# Patient Record
Sex: Female | Born: 1995 | Race: Black or African American | Hispanic: No | Marital: Single | Smoking: Never smoker
Health system: Southern US, Community
[De-identification: ages and names within clinical notes are randomized; demographics above are authoritative.]

## PROBLEM LIST (undated history)

## (undated) ENCOUNTER — Emergency Department (HOSPITAL_COMMUNITY): Disposition: A | Discharge status: Home / Self Care | Payer: Self-pay

## (undated) DIAGNOSIS — F209 Schizophrenia, unspecified: Secondary | ICD-10-CM

## (undated) DIAGNOSIS — R55 Syncope and collapse: Secondary | ICD-10-CM

## (undated) DIAGNOSIS — R413 Other amnesia: Secondary | ICD-10-CM

## (undated) HISTORY — DX: Other amnesia: R41.3

## (undated) HISTORY — DX: Syncope and collapse: R55

---
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
## 2016-01-11 ENCOUNTER — Emergency Department (HOSPITAL_COMMUNITY)
Admission: EM | Admit: 2016-01-11 | Discharge: 2016-01-12 | Disposition: A | Discharge status: Home / Self Care | Payer: Medicaid Other | Attending: Emergency Medicine | Admitting: Emergency Medicine

## 2016-01-11 ENCOUNTER — Encounter (HOSPITAL_COMMUNITY): Payer: Self-pay | Admitting: *Deleted

## 2016-01-11 DIAGNOSIS — F10220 Alcohol dependence with intoxication, uncomplicated: Secondary | ICD-10-CM | POA: Diagnosis not present

## 2016-01-11 DIAGNOSIS — R4182 Altered mental status, unspecified: Secondary | ICD-10-CM | POA: Diagnosis present

## 2016-01-11 DIAGNOSIS — F10920 Alcohol use, unspecified with intoxication, uncomplicated: Secondary | ICD-10-CM

## 2016-01-11 DIAGNOSIS — F1210 Cannabis abuse, uncomplicated: Secondary | ICD-10-CM | POA: Diagnosis not present

## 2016-01-11 DIAGNOSIS — Z79899 Other long term (current) drug therapy: Secondary | ICD-10-CM | POA: Insufficient documentation

## 2016-01-11 DIAGNOSIS — F209 Schizophrenia, unspecified: Secondary | ICD-10-CM | POA: Diagnosis not present

## 2016-01-11 MED ORDER — SODIUM CHLORIDE 0.9 % IV BOLUS (SEPSIS)
1000.0000 mL | Freq: Once | INTRAVENOUS | Status: AC
  Administered 2016-01-12: 1000 mL via INTRAVENOUS

## 2016-01-11 NOTE — ED Notes (Signed)
Pt arrives via ems. Per pt gf, pt had been drinking tonight, fell, did not hit her head and then became unresponsive. Pt was still unresponsive on arrival to the scene, lying on the front porch, when paramedic went to feel a pulse, pt jumped up and tried to go over the railing. Pt has continued to be combative. Pt has been coherent but uncooperative with questions. IV established, pt given Haladol 5, Versed 5, both given IM enroute.

## 2016-01-11 NOTE — ED Notes (Signed)
MD at bedside. 

## 2016-01-11 NOTE — ED Notes (Signed)
Bed: [id number] Expected date:  Expected time:  Means of arrival:  Comments: EMS 19yo F unresponsive episode / fighting EMS upon arrival

## 2016-01-11 NOTE — ED Provider Notes (Signed)
CSN: [id number]     Arrival date & time 01/11/16  2309 History  By signing my name below, I, Carrie Pierce, attest that this documentation has been prepared under the direction and in the presence of Allexis Bordenave, MD.  Electronically Signed: Rosario AdieWilliam Andrew Pierce, ED Scribe. 01/11/2016. 11:17 PM.  Chief Complaint  Patient presents with  . Altered Mental Status   Patient is a 20 y.o. female presenting with intoxication. The history is provided by the patient and the EMS personnel. The history is limited by the condition of the patient. No language interpreter was used.  Alcohol Intoxication This is a new problem. The problem occurs constantly. The problem has not changed since onset.Pertinent negatives include no chest pain. Nothing aggravates the symptoms. Nothing relieves the symptoms. She has tried nothing for the symptoms. The treatment provided no relief.   HPI Comments: Carrie Pierce is a 20 y.o. female brought in by ambulance who presents to the Emergency Department after a syncope episode s/p fall that occurred PTA. Per pts girlfriend, the patient fell after after a night of drinking and became unresponsive. Her girlfriend denies any head injury or the pt hitting her head. Per EMS, upon arrival she was combative. En route, she was given 5 of Haldol and 5 of Versed. Pt became alert in room with provider, and reports that she was drinking an unspecified amount of hard liquor. She denies SI currently or illicit drug usage PTA.   Past Medical History  Diagnosis Date  . Memory loss   . Syncope and collapse   . Schizophrenia (HCC)    History reviewed. No pertinent past surgical history. No family history on file. Social History  Substance Use Topics  . Smoking status: Never Smoker   . Smokeless tobacco: Never Used  . Alcohol Use: Yes   OB History    No data available     Review of Systems  Unable to perform ROS: Patient unresponsive  Cardiovascular: Negative for  chest pain.  Neurological: Positive for syncope.  Psychiatric/Behavioral: Negative for suicidal ideas.   Allergies  Review of patient's allergies indicates no known allergies.  Home Medications   Prior to Admission medications   Medication Sig Start Date End Date Taking? Authorizing Provider  ABILIFY 30 MG tablet Take 30 mg by mouth at bedtime. 04/04/15   Historical Provider, MD  ARIPiprazole (ABILIFY) 20 MG tablet Take 1 tablet (20 mg total) by mouth daily. Patient not taking: Reported on 05/06/2015 04/09/13   Jolene SchimkeKim B Winson, NP  hydrOXYzine (VISTARIL) 25 MG capsule Take 25 mg by mouth 2 (two) times daily. 04/02/15   Historical Provider, MD  permethrin (ELIMITE) 5 % cream Apply from the neck down and leave on overnight. Wash off in the morning Patient not taking: Reported on 05/06/2015 08/18/13   Rodolph BongEvan S Corey, MD  triamcinolone cream (KENALOG) 0.1 % Apply 1 application topically 2 (two) times daily. For itching Patient not taking: Reported on 05/06/2015 08/18/13   Rodolph BongEvan S Corey, MD   BP 114/57 mmHg  Pulse 88  Temp(Src) 98.2 F (36.8 C) (Oral)  Resp 20  SpO2 98%   Physical Exam  Constitutional: She appears well-developed and well-nourished. No distress.  HENT:  Head: Normocephalic and atraumatic. Head is without raccoon's eyes and without Battle's sign.  Right Ear: No hemotympanum.  Left Ear: No hemotympanum.  Mouth/Throat: Oropharynx is clear and moist. No oropharyngeal exudate.  Eyes: Conjunctivae are normal.  Pt has pinpoint pupils bilaterally.   Neck:  Trachea normal and normal range of motion. Neck supple. No JVD present. Carotid bruit is not present.  Cardiovascular: Normal rate, regular rhythm, normal heart sounds and intact distal pulses.  Exam reveals no gallop and no friction rub.   No murmur heard. Pulmonary/Chest: Effort normal and breath sounds normal. No stridor. She has no wheezes. She has no rales.  Abdominal: Soft. Bowel sounds are normal. She exhibits no mass. There is  no tenderness. There is no rebound and no guarding.  Musculoskeletal: Normal range of motion.  Lymphadenopathy:    She has no cervical adenopathy.  Neurological: She is alert. She has normal reflexes.  Skin: Skin is warm and dry. She is not diaphoretic.  Psychiatric:  unable  Nursing note and vitals reviewed.   ED Course  Procedures (including critical care time)  DIAGNOSTIC STUDIES: Oxygen Saturation is 98% on RA, normal by my interpretation.   Labs Review Labs Reviewed - No data to display  Imaging Review No results found. I have personally reviewed and evaluated these images and lab results as part of my medical decision-making.   EKG Interpretation None      MDM   Final diagnoses:  None   2:27 AM- Pt successfully completed PO challenged, is A&Ox4 and is currently stable for discharge.   Filed Vitals:   01/12/16 0200 01/12/16 0233  BP: 97/54 105/53  Pulse: 82 84  Temp:    Resp: 19 19   Results for orders placed or performed during the hospital encounter of 01/11/16  CBC with Differential/Platelet  Result Value Ref Range   WBC 6.4 4.0 - 10.5 K/uL   RBC 4.37 3.87 - 5.11 MIL/uL   Hemoglobin 12.7 12.0 - 15.0 g/dL   HCT 19.1 47.8 - 29.5 %   MCV 83.3 78.0 - 100.0 fL   MCH 29.1 26.0 - 34.0 pg   MCHC 34.9 30.0 - 36.0 g/dL   RDW 62.1 30.8 - 65.7 %   Platelets 196 150 - 400 K/uL   Neutrophils Relative % 77 %   Neutro Abs 5.0 1.7 - 7.7 K/uL   Lymphocytes Relative 18 %   Lymphs Abs 1.2 0.7 - 4.0 K/uL   Monocytes Relative 5 %   Monocytes Absolute 0.3 0.1 - 1.0 K/uL   Eosinophils Relative 0 %   Eosinophils Absolute 0.0 0.0 - 0.7 K/uL   Basophils Relative 0 %   Basophils Absolute 0.0 0.0 - 0.1 K/uL  Acetaminophen level  Result Value Ref Range   Acetaminophen (Tylenol), Serum <10 (L) 10 - 30 ug/mL  Salicylate level  Result Value Ref Range   Salicylate Lvl <4.0 2.8 - 30.0 mg/dL  Rapid urine drug screen (hospital performed)  Result Value Ref Range   Opiates  NONE DETECTED NONE DETECTED   Cocaine NONE DETECTED NONE DETECTED   Benzodiazepines POSITIVE (A) NONE DETECTED   Amphetamines NONE DETECTED NONE DETECTED   Tetrahydrocannabinol POSITIVE (A) NONE DETECTED   Barbiturates NONE DETECTED NONE DETECTED  Ethanol  Result Value Ref Range   Alcohol, Ethyl (B) 145 (H) <5 mg/dL  I-Stat Chem 8, ED  Result Value Ref Range   Sodium 144 135 - 145 mmol/L   Potassium 3.8 3.5 - 5.1 mmol/L   Chloride 107 101 - 111 mmol/L   BUN 3 (L) 6 - 20 mg/dL   Creatinine, Ser 8.46 0.44 - 1.00 mg/dL   Glucose, Bld 85 65 - 99 mg/dL   Calcium, Ion 9.62 9.52 - 1.30 mmol/L   TCO2 24 0 -  100 mmol/L   Hemoglobin 13.3 12.0 - 15.0 g/dL   HCT 40.939.0 81.136.0 - 91.446.0 %  I-Stat Beta hCG blood, ED ([facility], [facility], [facility] only)  Result Value Ref Range   I-stat hCG, quantitative <5.0 <5 mIU/mL   Comment 3           No results found.  Medications  sodium chloride 0.9 % bolus 1,000 mL (0 mLs Intravenous Stopped 01/12/16 0200)   I personally performed the services described in this documentation, which was scribed in my presence. The recorded information has been reviewed and is accurate.        Cy BlamerApril Baeleigh Devincent, MD 01/12/16 [phone]

## 2016-01-11 NOTE — ED Notes (Signed)
Pt not cooperative with triage questions, only shrugs her shoulders.

## 2016-01-12 ENCOUNTER — Encounter (HOSPITAL_COMMUNITY): Payer: Self-pay | Admitting: Emergency Medicine

## 2016-01-12 LAB — I-STAT CHEM 8, ED
BUN: 3 mg/dL — ABNORMAL LOW (ref 6–20)
CALCIUM ION: 1.13 mmol/L (ref 1.13–1.30)
CHLORIDE: 107 mmol/L (ref 101–111)
CREATININE: 0.7 mg/dL (ref 0.44–1.00)
GLUCOSE: 85 mg/dL (ref 65–99)
HCT: 39 % (ref 36.0–46.0)
HEMOGLOBIN: 13.3 g/dL (ref 12.0–15.0)
POTASSIUM: 3.8 mmol/L (ref 3.5–5.1)
Sodium: 144 mmol/L (ref 135–145)
TCO2: 24 mmol/L (ref 0–100)

## 2016-01-12 LAB — CBC WITH DIFFERENTIAL/PLATELET
BASOS ABS: 0 10*3/uL (ref 0.0–0.1)
Basophils Relative: 0 %
Eosinophils Absolute: 0 10*3/uL (ref 0.0–0.7)
Eosinophils Relative: 0 %
HEMATOCRIT: 36.4 % (ref 36.0–46.0)
Hemoglobin: 12.7 g/dL (ref 12.0–15.0)
LYMPHS ABS: 1.2 10*3/uL (ref 0.7–4.0)
LYMPHS PCT: 18 %
MCH: 29.1 pg (ref 26.0–34.0)
MCHC: 34.9 g/dL (ref 30.0–36.0)
MCV: 83.3 fL (ref 78.0–100.0)
MONO ABS: 0.3 10*3/uL (ref 0.1–1.0)
Monocytes Relative: 5 %
NEUTROS ABS: 5 10*3/uL (ref 1.7–7.7)
Neutrophils Relative %: 77 %
Platelets: 196 10*3/uL (ref 150–400)
RBC: 4.37 MIL/uL (ref 3.87–5.11)
RDW: 12.7 % (ref 11.5–15.5)
WBC: 6.4 10*3/uL (ref 4.0–10.5)

## 2016-01-12 LAB — RAPID URINE DRUG SCREEN, HOSP PERFORMED
Amphetamines: NOT DETECTED
BARBITURATES: NOT DETECTED
BENZODIAZEPINES: POSITIVE — AB
COCAINE: NOT DETECTED
Opiates: NOT DETECTED
TETRAHYDROCANNABINOL: POSITIVE — AB

## 2016-01-12 LAB — I-STAT BETA HCG BLOOD, ED (MC, WL, AP ONLY): I-stat hCG, quantitative: <5 m[IU]/mL (ref <5)

## 2016-01-12 LAB — ACETAMINOPHEN LEVEL: Acetaminophen (Tylenol), Serum: <10 ug/mL — ABNORMAL LOW (ref 10–30)

## 2016-01-12 LAB — ETHANOL: Alcohol, Ethyl (B): 145 mg/dL — ABNORMAL HIGH (ref <5)

## 2016-01-12 LAB — SALICYLATE LEVEL: Salicylate Lvl: <4 mg/dL (ref 2.8–30.0)

## 2016-01-12 NOTE — ED Notes (Signed)
Pt is fully oriented, ambulatory, and not suicidal

## 2016-01-12 NOTE — Discharge Instructions (Signed)
Alcohol Intoxication Alcohol intoxication occurs when you drink enough alcohol that it affects your ability to function. It can be mild or very severe. Drinking a lot of alcohol in a short time is called binge drinking. This can be very harmful. Drinking alcohol can also be more dangerous if you are taking medicines or other drugs. Some of the effects caused by alcohol may include:  Loss of coordination.  Changes in mood and behavior.  Unclear thinking.  Trouble talking (slurred speech).  Throwing up (vomiting).  Confusion.  Slowed breathing.  Twitching and shaking (seizures).  Loss of consciousness. HOME CARE  Do not drive after drinking alcohol.  Drink enough water and fluids to keep your pee (urine) clear or pale yellow. Avoid caffeine.  Only take medicine as told by your doctor. GET HELP IF:  You throw up (vomit) many times.  You do not feel better after a few days.  You frequently have alcohol intoxication. Your doctor can help decide if you should see a substance use treatment counselor. GET HELP RIGHT AWAY IF:  You become shaky when you stop drinking.  You have twitching and shaking.  You throw up blood. It may look bright red or like coffee grounds.  You notice blood in your poop (bowel movements).  You become lightheaded or pass out (faint). MAKE SURE YOU:   Understand these instructions.  Will watch your condition.  Will get help right away if you are not doing well or get worse.   This information is not intended to replace advice given to you by your health care provider. Make sure you discuss any questions you have with your health care provider.   Document Released: 12/18/2007 Document Revised: 03/03/2013 Document Reviewed: 12/04/2012 Elsevier Interactive Patient Education 2016 [employer]  Cannabis Use Disorder Cannabis use disorder is a mental disorder. It is not one-time or occasional use of cannabis, more commonly known as marijuana.  Cannabis use disorder is the continued, nonmedical use of cannabis that interferes with normal life activities or causes health problems. People with cannabis use disorder get a feeling of extreme pleasure and relaxation from cannabis use. This "high" is very rewarding and causes people to use over and over.  The mind-altering ingredient in cannabis is know as THC. THC can also interfere with motor coordination, memory, judgment, and accurate sense of space and time. These effects can last for a few days after using cannabis. Regular heavy cannabis use can cause long-lasting problems with thinking and learning. In young people, these problems may be permanent. Cannabis sometimes causes severe anxiety, paranoia, or visual hallucinations. Man-made (synthetic) cannabis-like drugs, such as "spice" and "K2," cause the same effects as THC but are much stronger. Cannabis-like drugs can cause dangerously high blood pressure and heart rate.  Cannabis use disorder usually starts in the teenage years. It can trigger the development of schizophrenia. It is somewhat more common in men than women. People who have family members with the disorder or existing mental health issues such as depression and posttraumatic stress disorderare more likely to develop cannabis use disorder. People with cannabis use disorder are at higher risk for use of other drugs of abuse.  SIGNS AND SYMPTOMS Signs and symptoms of cannabis use disorder include:   Use of cannabis in larger amounts or over a longer period than intended.   Unsuccessful attempts to cut down or control cannabis use.   A lot of time spent obtaining, using, or recovering from the effects of cannabis.  A strong desire or urge to use cannabis (cravings).   Continued use of cannabis in spite of problems at work, school, or home because of use.   Continued use of cannabis in spite of relationship problems because of use.  Giving up or cutting down on important  life activities because of cannabis use.  Use of cannabis over and over even in situations when it is physically hazardous, such as when driving a car.   Continued use of cannabis in spite of a physical problem that is likely related to use. Physical problems can include:  Chronic cough.  Bronchitis.  Emphysema.  Throat and lung cancer.  Continued use of cannabis in spite of a mental problem that is likely related to use. Mental problems can include:  Psychosis.  Anxiety.  Difficulty sleeping.  Need to use more and more cannabis to get the same effect, or lessened effect over time with use of the same amount (tolerance).  Having withdrawal symptoms when cannabis use is stopped, or using cannabis to reduce or avoid withdrawal symptoms. Withdrawal symptoms include:  Irritability or anger.  Anxiety or restlessness.  Difficulty sleeping.  Loss of appetite or weight.  Aches and pains.  Shakiness.  Sweating.  Chills. DIAGNOSIS Cannabis use disorder is diagnosed by your health care provider. You may be asked questions about your cannabis use and how it affects your life. A physical exam may be done. A drug screen may be done. You may be referred to a mental health professional. The diagnosis of cannabis use disorder requires at least two symptoms within 12 months. The type of cannabis use disorder you have depends on the number of symptoms you have. The type may be:  Mild. Two or three signs and symptoms.   Moderate. Four or five signs and symptoms.   Severe. Six or more signs and symptoms.  TREATMENT Treatment is usually provided by mental health professionals with training in substance use disorders. The following options are available:  Counseling or talk therapy. Talk therapy addresses the reasons you use cannabis. It also addresses ways to keep you from using again. The goals of talk therapy include:  Identifying and avoiding triggers for use.  Learning how  to handle cravings.  Replacing use with healthy activities.  Support groups. Support groups provide emotional support, advice, and guidance.  Medicine. Medicine is used to treat mental health issues that trigger cannabis use or that result from it. HOME CARE INSTRUCTIONS  Take medicines only as directed by your health care provider.  Check with your health care provider before starting any new medicines.  Keep all follow-up visits as directed by your health care provider. SEEK MEDICAL CARE IF:  You are not able to take your medicines as directed.  Your symptoms get worse. SEEK IMMEDIATE MEDICAL CARE IF: You have serious thoughts about hurting yourself or others. FOR MORE INFORMATION  National Institute on Drug Abuse: http://www.price-smith.com/www.drugabuse.gov  Substance Abuse and Mental Health Services Administration: SkateOasis.com.ptwww.samhsa.gov   This information is not intended to replace advice given to you by your health care provider. Make sure you discuss any questions you have with your health care provider.   Document Released: 06/28/2000 Document Revised: 07/22/2014 Document Reviewed: 07/14/2013 Elsevier Interactive Patient Education [employer] Elsevier Inc.

## 2016-02-18 ENCOUNTER — Encounter (HOSPITAL_COMMUNITY): Payer: Self-pay | Admitting: *Deleted

## 2016-02-18 ENCOUNTER — Emergency Department (HOSPITAL_COMMUNITY)
Admission: EM | Admit: 2016-02-18 | Discharge: 2016-02-18 | Disposition: A | Discharge status: Home / Self Care | Payer: Medicaid Other | Attending: Emergency Medicine | Admitting: Emergency Medicine

## 2016-02-18 DIAGNOSIS — N63 Unspecified lump in unspecified breast: Secondary | ICD-10-CM

## 2016-02-18 MED ORDER — DIPHENHYDRAMINE HCL 25 MG PO TABS: 25.0000 mg | ORAL_TABLET | Freq: Four times a day (QID) | ORAL | 0 refills | Status: DC

## 2016-02-18 NOTE — ED Triage Notes (Signed)
Pt complains of left breast swelling and redness for the past week. Pt denies discharge. Pt states her breast itches.

## 2016-02-18 NOTE — ED Provider Notes (Signed)
[facility]-EMERGENCY DEPT Provider Note   CSN: [id number] Arrival date & time: 02/18/16  4132  First Provider Contact:  None       History   Chief Complaint Chief Complaint  Patient presents with  . Breast Problem    HPI Carrie Pierce is a 20 y.o. female. PMH significant for schizophrenia and marijuana use. She states that over the past several weeks she has noticed her L breast is slightly more swollen than the right. She is worried because her grandmother (who is in there 22s) was just diagnosed with breast cancer. She reports associated itching at times. She denies fever, chills, redness to the area, pain over the breast, lump in the breast, nipple discharge. She denies pregnancy and states her periods are irregular (5 times a year). LMP was 01/28/2016.   HPI  Past Medical History:  Diagnosis Date  . Memory loss   . Schizophrenia (HCC)   . Syncope and collapse     Patient Active Problem List   Diagnosis Date Noted  . Cannabis abuse 04/05/2013  . Paranoid schizophrenia (HCC) 04/04/2013    History reviewed. No pertinent surgical history.  OB History    No data available       Home Medications    Prior to Admission medications   Medication Sig Start Date End Date Taking? Authorizing Provider  diphenhydrAMINE (BENADRYL) 25 MG tablet Take 1 tablet (25 mg total) by mouth every 6 (six) hours. For itching 02/18/16   Bethel Born, PA-C    Family History No family history on file.  Social History Social History  Substance Use Topics  . Smoking status: Never Smoker  . Smokeless tobacco: Never Used  . Alcohol use Yes     Allergies   Review of patient's allergies indicates no known allergies.   Review of Systems Review of Systems  Constitutional: Negative for fever and unexpected weight change.  Genitourinary:       +Breast swelling. -Breast tenderness, nipple discharge, erythema of breast, lump in breast     Physical Exam Updated Vital Signs BP  103/76   Pulse 83   Temp 98 F (36.7 C) (Oral)   Resp 16   LMP 01/28/2016   SpO2 100%   Physical Exam  Constitutional: She is oriented to person, place, and time. She appears well-developed and well-nourished. No distress.  HENT:  Head: Normocephalic and atraumatic.  Eyes: Conjunctivae are normal. Pupils are equal, round, and reactive to light. Right eye exhibits no discharge. Left eye exhibits no discharge. No scleral icterus.  Neck: Normal range of motion. Neck supple.  Cardiovascular: Normal rate and regular rhythm.   No murmur heard. Pulmonary/Chest: Effort normal and breath sounds normal. No respiratory distress. Right breast exhibits no inverted nipple, no mass, no nipple discharge, no skin change and no tenderness. Left breast exhibits no inverted nipple, no mass, no nipple discharge, no skin change and no tenderness. Breasts are symmetrical. There is no breast swelling.  Abdominal: Soft. She exhibits no distension. There is no tenderness.  Genitourinary: No breast tenderness, discharge or bleeding.  Musculoskeletal: She exhibits no edema.  Neurological: She is alert and oriented to person, place, and time.  Skin: Skin is warm and dry.  Psychiatric: She has a normal mood and affect. Her behavior is normal.  Nursing note and vitals reviewed.    ED Treatments / Results  Labs (all labs ordered are listed, but only abnormal results are displayed) Labs Reviewed - No data to display  EKG  EKG Interpretation None       Radiology No results found.  Procedures Procedures (including critical care time)  Medications Ordered in ED Medications - No data to display   Initial Impression / Assessment and Plan / ED Course  I have reviewed the triage vital signs and the nursing notes.  Pertinent labs & imaging results that were available during my care of the patient were reviewed by me and considered in my medical decision making (see chart for details).  Clinical Course     20 year old female presents with a complaint of breast swelling. Breast appears normal to me; unclear etiology. Advised there is nothing emergent that would warrant imaging at this time. OBGYN follow up given. Benadryl given for itching. Patient is NAD, non-toxic, with stable VS. Patient is informed of clinical course, understands medical decision making process, and agrees with plan. Opportunity for questions provided and all questions answered. Return precautions given.   Final Clinical Impressions(s) / ED Diagnoses   Final diagnoses:  Breast swelling    New Prescriptions New Prescriptions   DIPHENHYDRAMINE (BENADRYL) 25 MG TABLET    Take 1 tablet (25 mg total) by mouth every 6 (six) hours. For itching     Bethel BornKelly Marie Cedarius Kersh, PA-C 02/19/16 1726    Lyndal Pulleyaniel Knott, MD 02/21/16 [phone]

## 2016-04-06 ENCOUNTER — Encounter (HOSPITAL_COMMUNITY): Payer: Self-pay | Admitting: *Deleted

## 2016-04-06 ENCOUNTER — Emergency Department (HOSPITAL_COMMUNITY): Payer: Medicaid Other

## 2016-04-06 ENCOUNTER — Emergency Department (HOSPITAL_COMMUNITY)
Admission: EM | Admit: 2016-04-06 | Discharge: 2016-04-06 | Disposition: A | Discharge status: Home / Self Care | Payer: Medicaid Other | Attending: Emergency Medicine | Admitting: Emergency Medicine

## 2016-04-06 DIAGNOSIS — Y929 Unspecified place or not applicable: Secondary | ICD-10-CM | POA: Diagnosis not present

## 2016-04-06 DIAGNOSIS — Y939 Activity, unspecified: Secondary | ICD-10-CM | POA: Diagnosis not present

## 2016-04-06 DIAGNOSIS — Y999 Unspecified external cause status: Secondary | ICD-10-CM | POA: Diagnosis not present

## 2016-04-06 DIAGNOSIS — W2201XA Walked into wall, initial encounter: Secondary | ICD-10-CM | POA: Diagnosis not present

## 2016-04-06 DIAGNOSIS — S62326A Displaced fracture of shaft of fifth metacarpal bone, right hand, initial encounter for closed fracture: Secondary | ICD-10-CM | POA: Diagnosis not present

## 2016-04-06 DIAGNOSIS — S62309A Unspecified fracture of unspecified metacarpal bone, initial encounter for closed fracture: Secondary | ICD-10-CM

## 2016-04-06 DIAGNOSIS — S6991XA Unspecified injury of right wrist, hand and finger(s), initial encounter: Secondary | ICD-10-CM | POA: Diagnosis present

## 2016-04-06 MED ORDER — HYDROCODONE-ACETAMINOPHEN 5-325 MG PO TABS
2.0000 | ORAL_TABLET | Freq: Once | ORAL | Status: AC
  Administered 2016-04-06: 2 via ORAL
  Filled 2016-04-06: qty 2

## 2016-04-06 MED ORDER — HYDROCODONE-ACETAMINOPHEN 5-325 MG PO TABS: 1.0000 | ORAL_TABLET | Freq: Four times a day (QID) | ORAL | 0 refills | Status: DC | PRN

## 2016-04-06 NOTE — ED Notes (Signed)
PA-C to see and assess patient before RN assessment. See PA note. 

## 2016-04-06 NOTE — ED Triage Notes (Signed)
The pt struck a wall with her rt hand early this am last night.  She has bruising and swelling over her rt  5th metacarpal  lmp last month

## 2016-04-06 NOTE — ED Notes (Signed)
Ortho tech notified of need for splint 

## 2016-04-06 NOTE — ED Notes (Signed)
Ortho tech at bedside 

## 2016-04-06 NOTE — Progress Notes (Signed)
Orthopedic Tech Progress Note Patient Details:  Carrie Pierce 10/03/1995 [id number]  Ortho Devices Type of Ortho Device: Arm sling, Ulna gutter splint Ortho Device/Splint Location: rue Ortho Device/Splint Interventions: Ordered, Application   Carrie Pierce, Henson Fraticelli J 04/06/2016, 9:57 PM

## 2016-04-06 NOTE — ED Provider Notes (Signed)
[facility]-EMERGENCY DEPT Provider Note   CSN: [id number] Arrival date & time: 04/06/16  1913  By signing my name below, I, Christy SartoriusAnastasia Kolousek, attest that this documentation has been prepared under the direction and in the presence of  Roxy Horsemanobert Donyelle Enyeart, [place]PA-C. Electronically Signed: Christy SartoriusAnastasia Kolousek, ED Scribe. 04/06/16. 8:50 PM.  History   Chief Complaint Chief Complaint  Patient presents with  . Hand Injury   The history is provided by the patient and medical records. No language interpreter was used.    HPI Comments:  Carrie Pierce is a 20 y.o. female who presents to the Emergency Department complaining of injury to her right hand after punching a cement wall this morning.  Her pain is worse with movement of her wrist and fingers.  No alleviating factors noted.  No additional injury or complaint.  Pt has a fresh tattoo on her right forearm.    Past Medical History:  Diagnosis Date  . Memory loss   . Schizophrenia (HCC)   . Syncope and collapse     Patient Active Problem List   Diagnosis Date Noted  . Cannabis abuse 04/05/2013  . Paranoid schizophrenia (HCC) 04/04/2013    History reviewed. No pertinent surgical history.  OB History    No data available       Home Medications    Prior to Admission medications   Medication Sig Start Date End Date Taking? Authorizing Provider  diphenhydrAMINE (BENADRYL) 25 MG tablet Take 1 tablet (25 mg total) by mouth every 6 (six) hours. For itching 02/18/16   Bethel BornKelly Marie Gekas, PA-C    Family History No family history on file.  Social History Social History  Substance Use Topics  . Smoking status: Never Smoker  . Smokeless tobacco: Never Used  . Alcohol use Yes     Allergies   Review of patient's allergies indicates no known allergies.   Review of Systems Review of Systems  Musculoskeletal: Positive for arthralgias and myalgias.  Skin: Negative for color change and wound.  Neurological: Negative for weakness and  numbness.  All other systems reviewed and are negative.    Physical Exam Updated Vital Signs BP 121/62 (BP Location: Left Arm)   Pulse 86   Temp 98.5 F (36.9 C) (Oral)   Resp 16   Ht 5\' 6"  (1.676 m)   Wt 130 lb (59 kg)   LMP 03/06/2016   SpO2 100%   BMI 20.98 kg/m   Physical Exam  Physical Exam  Constitutional: Pt appears well-developed and well-nourished. No distress.  HENT:  Head: Normocephalic and atraumatic.  Eyes: Conjunctivae are normal.  Neck: Normal range of motion.  Cardiovascular: Normal rate, regular rhythm and intact distal pulses.   Capillary refill < 3 sec  Pulmonary/Chest: Effort normal and breath sounds normal.  Musculoskeletal: Right Hand: Pt exhibits tenderness to palpation over right proximal right 5th metacarpal. Pt exhibits no edema.  ROM: 4/5 limited by pain  Neurological: Pt  is alert. Coordination normal.  Sensation 5/5 Strength 4/5 limited by pain  Skin: Skin is warm and dry. Pt is not diaphoretic.  No tenting of the skin Skin intact  Psychiatric: Pt has a normal mood and affect.  Nursing note and vitals reviewed.   ED Treatments / Results   DIAGNOSTIC STUDIES:  Oxygen Saturation is 100% on RA, NML by my interpretation.    COORDINATION OF CARE:  8:50 PM Discussed treatment plan with pt at bedside and pt agreed to plan.  Labs (all labs ordered are  listed, but only abnormal results are displayed) Labs Reviewed - No data to display  EKG  EKG Interpretation None       Radiology Dg Hand Complete Right  Result Date: 04/06/2016 CLINICAL DATA:  Patient struck wall EXAM: RIGHT HAND - COMPLETE 3+ VIEW COMPARISON:  None. FINDINGS: Frontal, oblique, and lateral views were obtained. There is a fracture of the proximal fifth metacarpal with slight medial displacement and volar angulation of the distal fracture fragment with respect to the proximal fragment. There is soft tissue swelling in this area. No other fracture. No dislocation.  Joint spaces appear intact. IMPRESSION: Mildly displaced and angulated fracture proximal aspect fifth metacarpal. No other fracture. No dislocation. Joint spaces appear unremarkable. Electronically Signed   By: Bretta Bang III M.D.   On: 04/06/2016 20:20    Procedures Procedures (including critical care time)  Medications Ordered in ED Medications  HYDROcodone-acetaminophen (NORCO/VICODIN) 5-325 MG per tablet 2 tablet (not administered)     Initial Impression / Assessment and Plan / ED Course  I have reviewed the triage vital signs and the nursing notes.  Pertinent labs & imaging results that were available during my care of the patient were reviewed by me and considered in my medical decision making (see chart for details).  Clinical Course    SPLINT APPLICATION Date/Time: 9:32 PM Authorized by: Roxy Horseman Consent: Verbal consent obtained. Risks and benefits: risks, benefits and alternatives were discussed Consent given by: patient Splint applied by: orthopedic technician Location details: right hand Splint type: ulnar gutter Supplies used: fiberglass and ace Post-procedure: The splinted body part was neurovascularly unchanged following the procedure. Patient tolerance: Patient tolerated the procedure well with no immediate complications.  Patient with base of right 5th metacarpal fracture.  Will need to see hand surgery for possible pinning.  Will give referral and splint in ED with ulnar gutter splint.   Final Clinical Impressions(s) / ED Diagnoses   Final diagnoses:  Metacarpal bone fracture, closed, initial encounter    New Prescriptions New Prescriptions   HYDROCODONE-ACETAMINOPHEN (NORCO/VICODIN) 5-325 MG TABLET    Take 1-2 tablets by mouth every 6 (six) hours as needed.   I personally performed the services described in this documentation, which was scribed in my presence. The recorded information has been reviewed and is accurate.       Roxy Horseman, PA-C 04/06/16 2134    Lavera Guise, MD 04/07/16 [id number]

## 2016-04-06 NOTE — ED Notes (Signed)
Patient verbalized understanding of discharge instructions and denies any further needs or questions at this time. VS stable. Patient ambulatory with steady gait. Pt declined wheelchair, RN escorted to ED entrance.   

## 2016-12-09 ENCOUNTER — Encounter: Payer: Self-pay | Admitting: *Deleted

## 2016-12-09 ENCOUNTER — Emergency Department
Admission: EM | Admit: 2016-12-09 | Discharge: 2016-12-09 | Disposition: A | Discharge status: Home / Self Care | Payer: Medicaid Other | Attending: Emergency Medicine | Admitting: Emergency Medicine

## 2016-12-09 DIAGNOSIS — R21 Rash and other nonspecific skin eruption: Secondary | ICD-10-CM | POA: Diagnosis present

## 2016-12-09 MED ORDER — PREDNISONE 10 MG (21) PO TBPK: ORAL_TABLET | ORAL | 0 refills | Status: DC

## 2016-12-09 NOTE — ED Triage Notes (Signed)
Pt was seen at urgent care Monday for a rash to bilateral arms and legs, pt diagnosed with eczema, pt reports she now has bruises to bilateral legs, pt denies any other symptoms

## 2016-12-09 NOTE — ED Notes (Signed)
Pt states that she has had bruises appearing on her legs over the last 2 days - she also reports fine red rash on bilat lower ext and the rash itches

## 2016-12-09 NOTE — ED Provider Notes (Signed)
[facility] Emergency Department Provider Note  ____________________________________________  Time seen: Approximately 11:35 PM  I have reviewed the triage vital signs and the nursing notes.   HISTORY  Chief Complaint Rash    HPI Carrie Pierce is a 21 y.o. female that presents to emergency department for evaluation of a couple of red spots to her arms and legs that have been present for 1 week. They are nontender, and they do not itch. She does not recall an insect biting her. Patient went to urgent care last Monday and was told that she has eczema. She was prescribed a cream but never picked it up. She does not think it is eczema. She is frustrated that the spots do not go away. She states that she also has a couple new bruises on her lower legs for a couple of days. They are nontender. She does not remember hitting her legs on anything. She plays basketball but has not been playing recently. She denies fever, shortness breath, chest pain, nausea, vomiting, abdominal pain, numbness, tingling.   Past Medical History:  Diagnosis Date  . Memory loss   . Schizophrenia (HCC)   . Syncope and collapse     Patient Active Problem List   Diagnosis Date Noted  . Cannabis abuse 04/05/2013  . Paranoid schizophrenia (HCC) 04/04/2013    History reviewed. No pertinent surgical history.  Prior to Admission medications   Medication Sig Start Date End Date Taking? Authorizing Provider  diphenhydrAMINE (BENADRYL) 25 MG tablet Take 1 tablet (25 mg total) by mouth every 6 (six) hours. For itching 02/18/16   Bethel Born, PA-C  HYDROcodone-acetaminophen (NORCO/VICODIN) 5-325 MG tablet Take 1-2 tablets by mouth every 6 (six) hours as needed. 04/06/16   Roxy Horseman, PA-C  predniSONE (STERAPRED UNI-PAK 21 TAB) 10 MG (21) TBPK tablet Take 6 tablets on day 1, take 5 tablets on day 2, take 4 tablets on day 3, take 3 tablets on day 4, take 2 tablets on day 5, take 1  tablet on day 6 12/09/16   Enid Derry, PA-C    Allergies Patient has no known allergies.  No family history on file.  Social History Social History  Substance Use Topics  . Smoking status: Never Smoker  . Smokeless tobacco: Never Used  . Alcohol use Yes     Review of Systems  Constitutional: No fever/chills Cardiovascular: No chest pain. Respiratory: No SOB. Gastrointestinal: No abdominal pain.  No nausea, no vomiting.  Musculoskeletal: Negative for musculoskeletal pain. Skin: Negative for abrasions, lacerations. Neurological: Negative for headaches, numbness or tingling   ____________________________________________   PHYSICAL EXAM:  VITAL SIGNS: ED Triage Vitals  Enc Vitals Group     BP 12/09/16 2229 131/69     Pulse Rate 12/09/16 2229 74     Resp 12/09/16 2229 20     Temp 12/09/16 2229 98.7 F (37.1 C)     Temp Source 12/09/16 2229 Oral     SpO2 12/09/16 2229 99 %     Weight 12/09/16 2229 132 lb (59.9 kg)     Height 12/09/16 2229 5\' 7"  (1.702 m)     Head Circumference --      Peak Flow --      Pain Score 12/09/16 2239 3     Pain Loc --      Pain Edu? --      Excl. in GC? --      Constitutional: Alert and oriented. Well appearing and in no  acute distress. Eyes: Conjunctivae are normal. PERRL. EOMI. Head: Atraumatic. ENT:      Ears:      Nose: No congestion/rhinnorhea.      Mouth/Throat: Mucous membranes are moist.  Neck: No stridor.   Cardiovascular: Normal rate, regular rhythm.  Good peripheral circulation. Respiratory: Normal respiratory effort without tachypnea or retractions. Lungs CTAB. Good air entry to the bases with no decreased or absent breath sounds. Musculoskeletal: Full range of motion to all extremities. No gross deformities appreciated. Neurologic:  Normal speech and language. No gross focal neurologic deficits are appreciated.  Skin:  Skin is warm, dry and intact. 2, 1 cm nontender areas of ecchymosis to lower legs bilaterally. 1/4  cm red macule on right arm. 4, 1/4 cm red macules on bilateral lower legs. No scaling or drainage. Nontender.   ____________________________________________   LABS (all labs ordered are listed, but only abnormal results are displayed)  Labs Reviewed - No data to display ____________________________________________  EKG   ____________________________________________  RADIOLOGY  No results found.  ____________________________________________    PROCEDURES  Procedure(s) performed:    Procedures    Medications - No data to display   ____________________________________________   INITIAL IMPRESSION / ASSESSMENT AND PLAN / ED COURSE  Pertinent labs & imaging results that were available during my care of the patient were reviewed by me and considered in my medical decision making (see chart for details).  Review of the [place] CSRS was performed in accordance of the [facility] prior to dispensing any controlled drugs.   Patient presented to the emergency department with rash for 1 week. Vital signs and exam are reassuring. Patient will be discharged home with prescriptions for prednisone. Patient is to follow up with PCP as directed. Patient is given ED precautions to return to the ED for any worsening or new symptoms.     ____________________________________________  FINAL CLINICAL IMPRESSION(S) / ED DIAGNOSES  Final diagnoses:  Rash and nonspecific skin eruption      NEW MEDICATIONS STARTED DURING THIS VISIT:  Discharge Medication List as of 12/09/2016 11:33 PM    START taking these medications   Details  predniSONE (STERAPRED UNI-PAK 21 TAB) 10 MG (21) TBPK tablet Take 6 tablets on day 1, take 5 tablets on day 2, take 4 tablets on day 3, take 3 tablets on day 4, take 2 tablets on day 5, take 1 tablet on day 6, Print            This chart was dictated using voice recognition software/Dragon. Despite best efforts to proofread, errors can occur which can  change the meaning. Any change was purely unintentional.    Enid DerryWagner, Amiri Riechers, PA-C 12/09/16 2346    Jeanmarie PlantMcShane, James A, MD 12/14/16 2232

## 2020-07-11 ENCOUNTER — Other Ambulatory Visit: Payer: Self-pay

## 2020-07-11 ENCOUNTER — Emergency Department (HOSPITAL_COMMUNITY): Payer: Medicaid Other

## 2020-07-11 ENCOUNTER — Emergency Department (HOSPITAL_COMMUNITY)
Admission: EM | Admit: 2020-07-11 | Discharge: 2020-07-11 | Disposition: A | Discharge status: Home / Self Care | Payer: Medicaid Other | Attending: Emergency Medicine | Admitting: Emergency Medicine

## 2020-07-11 ENCOUNTER — Encounter (HOSPITAL_COMMUNITY): Payer: Self-pay

## 2020-07-11 DIAGNOSIS — R0981 Nasal congestion: Secondary | ICD-10-CM | POA: Insufficient documentation

## 2020-07-11 DIAGNOSIS — R059 Cough, unspecified: Secondary | ICD-10-CM | POA: Diagnosis not present

## 2020-07-11 MED ORDER — BENZONATATE 100 MG PO CAPS: 100.0000 mg | ORAL_CAPSULE | Freq: Three times a day (TID) | ORAL | 0 refills | Status: DC | PRN

## 2020-07-11 NOTE — ED Provider Notes (Signed)
[name] EMERGENCY DEPARTMENT Provider Note   CSN: [id number] Arrival date & time: 07/11/20  1519     History Chief Complaint  Patient presents with   Cough    Carrie Pierce is a 24 y.o. female with past medical history as listed below.  HPI Patient presents to emergency department today with chief complaint of nonproductive cough x6 weeks.  She states the cough has been intermittent although is present most days.  She is also endorsing that her left nostril is tender inside.  This is been going on x2 days.  She denies any epistaxis.  She does endorse nasal congestion.  No medications tried for symptoms prior to arrival.  Patient states she vapes frequently, infrequently smokes Black and mild.  She denies any fever or chills, hoarseness, shortness of breath, chest pain.  No sick contacts or known Covid exposures.    Past Medical History:  Diagnosis Date   Memory loss    Schizophrenia (HCC)    Syncope and collapse     Patient Active Problem List   Diagnosis Date Noted   Cannabis abuse 04/05/2013   Paranoid schizophrenia (HCC) 04/04/2013    History reviewed. No pertinent surgical history.   OB History   No obstetric history on file.     No family history on file.  Social History   Tobacco Use   Smoking status: Never Smoker   Smokeless tobacco: Never Used  Substance Use Topics   Alcohol use: Yes   Drug use: No    Home Medications Prior to Admission medications   Medication Sig Start Date End Date Taking? Authorizing Provider  benzonatate (TESSALON) 100 MG capsule Take 1 capsule (100 mg total) by mouth every 8 (eight) hours as needed for cough. 07/11/20  Yes Walisiewicz, Mackinzee Roszak E, PA-C  diphenhydrAMINE (BENADRYL) 25 MG tablet Take 1 tablet (25 mg total) by mouth every 6 (six) hours. For itching 02/18/16   Bethel Born, PA-C  HYDROcodone-acetaminophen (NORCO/VICODIN) 5-325 MG tablet Take 1-2 tablets by mouth every 6 (six)  hours as needed. 04/06/16   Roxy Horseman, PA-C  predniSONE (STERAPRED UNI-PAK 21 TAB) 10 MG (21) TBPK tablet Take 6 tablets on day 1, take 5 tablets on day 2, take 4 tablets on day 3, take 3 tablets on day 4, take 2 tablets on day 5, take 1 tablet on day 6 12/09/16   Enid Derry, PA-C    Allergies    Patient has no known allergies.  Review of Systems   Review of Systems All other systems are reviewed and are negative for acute change except as noted in the HPI.  Physical Exam Updated Vital Signs BP 116/67 (BP Location: Right Arm)    Pulse 81    Temp 98.1 F (36.7 C) (Oral)    Resp 16    Ht 5\' 7"  (1.702 m)    Wt 60.8 kg    SpO2 100%    BMI 20.99 kg/m   Physical Exam Vitals and nursing note reviewed.  Constitutional:      Appearance: She is well-developed. She is not ill-appearing or toxic-appearing.  HENT:     Head: Normocephalic and atraumatic.     Comments: Erythema noted internal left nare.  No active bleeding.  No purulent drainage or open wound.    Nose: Congestion present.     Mouth/Throat:     Mouth: Mucous membranes are moist.     Pharynx: Oropharynx is clear. No oropharyngeal exudate.  Eyes:  General: No scleral icterus.       Right eye: No discharge.        Left eye: No discharge.     Conjunctiva/sclera: Conjunctivae normal.  Neck:     Vascular: No JVD.  Cardiovascular:     Rate and Rhythm: Normal rate and regular rhythm.     Pulses: Normal pulses.     Heart sounds: Normal heart sounds.  Pulmonary:     Effort: Pulmonary effort is normal.     Breath sounds: Normal breath sounds.     Comments: Lungs are clear to auscultation all fields.  Normal work of breathing.  Oxygen saturation 100% on room air during exam.  No respiratory distress. Abdominal:     General: There is no distension.  Musculoskeletal:        General: Normal range of motion.     Cervical back: Normal range of motion.  Skin:    General: Skin is warm and dry.  Neurological:     Mental  Status: She is oriented to person, place, and time.     GCS: GCS eye subscore is 4. GCS verbal subscore is 5. GCS motor subscore is 6.     Comments: Fluent speech, no facial droop.  Psychiatric:        Behavior: Behavior normal.     ED Results / Procedures / Treatments   Labs (all labs ordered are listed, but only abnormal results are displayed) Labs Reviewed - No data to display  EKG None  Radiology DG Chest 2 View  Result Date: 07/11/2020 CLINICAL DATA:  Cough for several weeks EXAM: CHEST - 2 VIEW COMPARISON:  None. FINDINGS: Lungs are clear. Heart size and pulmonary vascularity are normal. No adenopathy. No bone lesions. IMPRESSION: Lungs clear.  Cardiac silhouette normal. Electronically Signed   By: Bretta Bang III M.D.   On: 07/11/2020 16:46    Procedures Procedures (including critical care time)  Medications Ordered in ED Medications - No data to display  ED Course  I have reviewed the triage vital signs and the nursing notes.  Pertinent labs & imaging results that were available during my care of the patient were reviewed by me and considered in my medical decision making (see chart for details).    MDM Rules/Calculators/A&P                          History provided by patient with additional history obtained from chart review.    Afebrile, hemodynamically stable.  Well-appearing 24 year old female presenting with cough x6 weeks.  Lungs are clear to auscultation all fields and she has normal work of breathing.  She also has a sore in her left nare.  No obvious infection, no epistaxis. I viewed pt's chest xray and it does not suggest acute infectious processesShe denies chance of pregnancy and politely refuses pregnancy test.  Work-up and exam suggestive of bronchitis or viral URI.  Low suspicion for underlying bacterial illness.  Will discharge with symptomatic care including Tessalon Perles and recommend applying over-the-counter Neosporin for nasal sore.  Advised patient to follow-up with PCP if symptoms do not improve. Patietn agreeable with plan of care.   Portions of this note were generated with Scientist, clinical (histocompatibility and immunogenetics). Dictation errors may occur despite best attempts at proofreading.    Final Clinical Impression(s) / ED Diagnoses Final diagnoses:  Cough    Rx / DC Orders ED Discharge Orders  Ordered    benzonatate (TESSALON) 100 MG capsule  Every 8 hours PRN        07/11/20 1834           Shanon Ace, PA-C 07/11/20 1844    Melene Plan, DO 07/11/20 2249

## 2020-07-11 NOTE — Discharge Instructions (Signed)
-  Prescription sent to pharmacy for Tessalon.  This is a medicine that should help stop your cough.  It is likely you have bronchitis.  This can be a viral illness.  There are no antibiotics needed and you should continue symptomatic care as we discussed..  -You can try putting Neosporin or Vaseline in your nose to help with the sore.  Follow up with your primary care doctor as soon as possible for further evaluation of your symptoms.  You should do your best to stop smoking as it is bad for your health.

## 2020-07-11 NOTE — ED Triage Notes (Signed)
Patient complains of 6 weeks of dry cough and complains of left nostril tender. Patient smoker and NAD. No fever.

## 2020-07-13 ENCOUNTER — Encounter (HOSPITAL_COMMUNITY): Payer: Self-pay | Admitting: Emergency Medicine

## 2020-07-13 ENCOUNTER — Ambulatory Visit (HOSPITAL_COMMUNITY)
Admission: EM | Admit: 2020-07-13 | Discharge: 2020-07-13 | Disposition: A | Discharge status: Home / Self Care | Payer: Medicaid Other | Attending: Family Medicine | Admitting: Family Medicine

## 2020-07-13 ENCOUNTER — Other Ambulatory Visit: Payer: Self-pay

## 2020-07-13 DIAGNOSIS — R059 Cough, unspecified: Secondary | ICD-10-CM | POA: Diagnosis not present

## 2020-07-13 DIAGNOSIS — U071 COVID-19: Secondary | ICD-10-CM | POA: Insufficient documentation

## 2020-07-13 LAB — SARS CORONAVIRUS 2 (TAT 6-24 HRS): SARS Coronavirus 2: POSITIVE — AB

## 2020-07-13 MED ORDER — CLARITIN-D 12 HOUR 5-120 MG PO TB12: 1.0000 | ORAL_TABLET | Freq: Two times a day (BID) | ORAL | 0 refills | Status: DC

## 2020-07-13 MED ORDER — DEXTROMETHORPHAN POLISTIREX ER 30 MG/5ML PO SUER: 30.0000 mg | ORAL | 0 refills | Status: DC | PRN

## 2020-07-13 NOTE — ED Provider Notes (Signed)
[facility]    CSN: [id number] Arrival date & time: 07/13/20  0911      History   Chief Complaint Chief Complaint  Patient presents with  . URI    HPI Carrie Pierce is a 24 y.o. female.   HPI   Patient went to the emergency room yesterday for cough.  States she had a cough intermittently for 6 weeks.  She is here today because she continues to have cough runny nose and symptoms.  She was prescribed Tessalon.  It was not covered by her insurance.  She is here today hoping to get a cough medicine that is covered by insurance.  I did explain to her that Medicaid does not cover any cough preparations and that she has to shop for the least expensive preparation over-the-counter. She did not have Covid testing.  She request to have that done today.  She does not have Covid exposure.  She did not have Covid vaccinations  Past Medical History:  Diagnosis Date  . Memory loss   . Schizophrenia (HCC)   . Syncope and collapse     Patient Active Problem List   Diagnosis Date Noted  . Cannabis abuse 04/05/2013  . Paranoid schizophrenia (HCC) 04/04/2013    History reviewed. No pertinent surgical history.  OB History   No obstetric history on file.      Home Medications    Prior to Admission medications   Medication Sig Start Date End Date Taking? Authorizing Provider  dextromethorphan (DELSYM) 30 MG/5ML liquid Take 5 mLs (30 mg total) by mouth as needed for cough. 07/13/20  Yes Eustace Moore, MD  loratadine-pseudoephedrine (CLARITIN-D 12 HOUR) 5-120 MG tablet Take 1 tablet by mouth 2 (two) times daily. 07/13/20  Yes Eustace Moore, MD  benzonatate (TESSALON) 100 MG capsule Take 1 capsule (100 mg total) by mouth every 8 (eight) hours as needed for cough. 07/11/20   Walisiewicz, Caroleen Hamman, PA-C  diphenhydrAMINE (BENADRYL) 25 MG tablet Take 1 tablet (25 mg total) by mouth every 6 (six) hours. For itching 02/18/16   Bethel Born, PA-C   HYDROcodone-acetaminophen (NORCO/VICODIN) 5-325 MG tablet Take 1-2 tablets by mouth every 6 (six) hours as needed. 04/06/16   Roxy Horseman, PA-C  predniSONE (STERAPRED UNI-PAK 21 TAB) 10 MG (21) TBPK tablet Take 6 tablets on day 1, take 5 tablets on day 2, take 4 tablets on day 3, take 3 tablets on day 4, take 2 tablets on day 5, take 1 tablet on day 6 12/09/16   Enid Derry, PA-C    Family History History reviewed. No pertinent family history.  Social History Social History   Tobacco Use  . Smoking status: Never Smoker  . Smokeless tobacco: Never Used  Substance Use Topics  . Alcohol use: Yes  . Drug use: No     Allergies   Patient has no known allergies.   Review of Systems Review of Systems See HPI  Physical Exam Triage Vital Signs ED Triage Vitals  Enc Vitals Group     BP 07/13/20 1014 131/80     Pulse Rate 07/13/20 1014 85     Resp 07/13/20 1014 16     Temp 07/13/20 1014 99.2 F (37.3 C)     Temp Source 07/13/20 1014 Oral     SpO2 07/13/20 1014 100 %     Weight --      Height --      Head Circumference --  Peak Flow --      Pain Score 07/13/20 1117 0     Pain Loc --      Pain Edu? --      Excl. in GC? --    No data found.  Updated Vital Signs BP 131/80 (BP Location: Right Arm)   Pulse 85   Temp 99.2 F (37.3 C) (Oral)   Resp 16   LMP 06/26/2020   SpO2 100%       Physical Exam Constitutional:      General: She is not in acute distress.    Appearance: She is well-developed and well-nourished.  HENT:     Head: Normocephalic and atraumatic.     Nose:     Comments: Mask in place.  Clear rhinorrhea from nose.  Posterior pharynx is benign    Mouth/Throat:     Mouth: Oropharynx is clear and moist.  Eyes:     Conjunctiva/sclera: Conjunctivae normal.     Pupils: Pupils are equal, round, and reactive to light.  Cardiovascular:     Rate and Rhythm: Normal rate.  Pulmonary:     Effort: Pulmonary effort is normal. No respiratory distress.      Comments: Heart regular.  No tachycardia.  Lungs are clear Abdominal:     General: There is no distension.     Palpations: Abdomen is soft.  Musculoskeletal:        General: No edema. Normal range of motion.     Cervical back: Normal range of motion.  Skin:    General: Skin is warm and dry.  Neurological:     Mental Status: She is alert.  Psychiatric:        Behavior: Behavior normal.      UC Treatments / Results  Labs (all labs ordered are listed, but only abnormal results are displayed) Labs Reviewed  SARS CORONAVIRUS 2 (TAT 6-24 HRS)    EKG   Radiology DG Chest 2 View  Result Date: 07/11/2020 CLINICAL DATA:  Cough for several weeks EXAM: CHEST - 2 VIEW COMPARISON:  None. FINDINGS: Lungs are clear. Heart size and pulmonary vascularity are normal. No adenopathy. No bone lesions. IMPRESSION: Lungs clear.  Cardiac silhouette normal. Electronically Signed   By: Bretta Bang III M.D.   On: 07/11/2020 16:46    Procedures Procedures (including critical care time)  Medications Ordered in UC Medications - No data to display  Initial Impression / Assessment and Plan / UC Course  I have reviewed the triage vital signs and the nursing notes.  Pertinent labs & imaging results that were available during my care of the patient were reviewed by me and considered in my medical decision making (see chart for details).     ER notes and chest x-ray are reviewed. Final Clinical Impressions(s) / UC Diagnoses   Final diagnoses:  Cough     Discharge Instructions     Take the cough medicine 2 times a day Take Claritin-D 2 times a day Drink lots of water Check MyChart for Covid testing Quarantine until you get your test result    ED Prescriptions    Medication Sig Dispense Auth. Provider   dextromethorphan (DELSYM) 30 MG/5ML liquid Take 5 mLs (30 mg total) by mouth as needed for cough. 89 mL Eustace Moore, MD   loratadine-pseudoephedrine (CLARITIN-D 12 HOUR)  5-120 MG tablet Take 1 tablet by mouth 2 (two) times daily. 20 tablet Eustace Moore, MD     PDMP not reviewed this encounter.  Eustace Moore, MD 07/13/20 [phone]

## 2020-07-13 NOTE — Discharge Instructions (Signed)
Take the cough medicine 2 times a day Take Claritin-D 2 times a day Drink lots of water Check MyChart for Covid testing Quarantine until you get your test result

## 2020-07-13 NOTE — ED Triage Notes (Signed)
PT C/O: cold sx onset last night associated w/cough, congestion, headache, body ache, chills  Was seen at [facility] 2 days ago and dx w/Bronchitis  Has not had the flu shot or COVID shot  Pt smokes Black and milds cig x2/day and vapes as well.   DENIES: f/v/n/d  TAKING MEDS: none  A&O x4... NAD... Ambulatory

## 2020-08-23 ENCOUNTER — Encounter (HOSPITAL_COMMUNITY): Payer: Self-pay

## 2020-08-23 ENCOUNTER — Other Ambulatory Visit: Payer: Self-pay

## 2020-08-23 ENCOUNTER — Ambulatory Visit (HOSPITAL_COMMUNITY)
Admission: EM | Admit: 2020-08-23 | Discharge: 2020-08-23 | Disposition: A | Discharge status: Home / Self Care | Payer: Medicaid Other | Attending: Urgent Care | Admitting: Urgent Care

## 2020-08-23 DIAGNOSIS — H6982 Other specified disorders of Eustachian tube, left ear: Secondary | ICD-10-CM | POA: Insufficient documentation

## 2020-08-23 DIAGNOSIS — J069 Acute upper respiratory infection, unspecified: Secondary | ICD-10-CM | POA: Insufficient documentation

## 2020-08-23 DIAGNOSIS — T162XXA Foreign body in left ear, initial encounter: Secondary | ICD-10-CM | POA: Diagnosis not present

## 2020-08-23 DIAGNOSIS — Z20822 Contact with and (suspected) exposure to covid-19: Secondary | ICD-10-CM | POA: Diagnosis not present

## 2020-08-23 DIAGNOSIS — H9202 Otalgia, left ear: Secondary | ICD-10-CM | POA: Diagnosis not present

## 2020-08-23 MED ORDER — CETIRIZINE HCL 10 MG PO TABS: 10.0000 mg | ORAL_TABLET | Freq: Every day | ORAL | 0 refills | Status: AC

## 2020-08-23 MED ORDER — PSEUDOEPHEDRINE HCL 60 MG PO TABS: 60.0000 mg | ORAL_TABLET | Freq: Three times a day (TID) | ORAL | 0 refills | Status: AC | PRN

## 2020-08-23 MED ORDER — FLUTICASONE PROPIONATE 50 MCG/ACT NA SUSP: 2.0000 | Freq: Every day | NASAL | 12 refills | Status: AC

## 2020-08-23 NOTE — ED Provider Notes (Signed)
[facility]   MRN: [id number] DOB: 1995-11-11  Subjective:   Carrie Pierce is a 25 y.o. female presenting for left ear pain, left ear foreign body sensation, runny nose, cough for the past week. Patient is very worried that she has a bug in her ear. Has not used any medications for relief. Her sister tested positive for COVID-19 this past week. She was around her and passing. Denies chest pain, shortness of breath, fever, ear drainage, ringing. She has been inserting different things in her left ear trying to get the suspected foreign body out but have not seen anything in particular.  No current facility-administered medications for this encounter.  Current Outpatient Medications:  .  benzonatate (TESSALON) 100 MG capsule, Take 1 capsule (100 mg total) by mouth every 8 (eight) hours as needed for cough., Disp: 21 capsule, Rfl: 0 .  dextromethorphan (DELSYM) 30 MG/5ML liquid, Take 5 mLs (30 mg total) by mouth as needed for cough., Disp: 89 mL, Rfl: 0 .  diphenhydrAMINE (BENADRYL) 25 MG tablet, Take 1 tablet (25 mg total) by mouth every 6 (six) hours. For itching, Disp: 10 tablet, Rfl: 0 .  HYDROcodone-acetaminophen (NORCO/VICODIN) 5-325 MG tablet, Take 1-2 tablets by mouth every 6 (six) hours as needed., Disp: 10 tablet, Rfl: 0 .  loratadine-pseudoephedrine (CLARITIN-D 12 HOUR) 5-120 MG tablet, Take 1 tablet by mouth 2 (two) times daily., Disp: 20 tablet, Rfl: 0 .  predniSONE (STERAPRED UNI-PAK 21 TAB) 10 MG (21) TBPK tablet, Take 6 tablets on day 1, take 5 tablets on day 2, take 4 tablets on day 3, take 3 tablets on day 4, take 2 tablets on day 5, take 1 tablet on day 6, Disp: 21 tablet, Rfl: 0   No Known Allergies  Past Medical History:  Diagnosis Date  . Memory loss   . Schizophrenia (HCC)   . Syncope and collapse      History reviewed. No pertinent surgical history.  History reviewed. No pertinent family history.  Social History   Tobacco Use  .  Smoking status: Never Smoker  . Smokeless tobacco: Never Used  Substance Use Topics  . Alcohol use: Yes  . Drug use: No    ROS   Objective:   Vitals: BP 117/76   Pulse 82   Temp 98.7 F (37.1 C)   Resp 17   LMP 08/10/2020 (Approximate)   SpO2 98%   Physical Exam Constitutional:      General: She is not in acute distress.    Appearance: Normal appearance. She is well-developed. She is not ill-appearing, toxic-appearing or diaphoretic.  HENT:     Head: Normocephalic and atraumatic.     Right Ear: Tympanic membrane and ear canal normal. No drainage or tenderness. No middle ear effusion. Tympanic membrane is not erythematous.     Left Ear: Tympanic membrane and ear canal normal. No drainage or tenderness.  No middle ear effusion. Tympanic membrane is not erythematous.     Nose: Nose normal. No congestion or rhinorrhea.     Mouth/Throat:     Mouth: Mucous membranes are moist. No oral lesions.     Pharynx: Oropharynx is clear. No pharyngeal swelling, oropharyngeal exudate, posterior oropharyngeal erythema or uvula swelling.     Tonsils: No tonsillar exudate or tonsillar abscesses.  Eyes:     Extraocular Movements: Extraocular movements intact.     Right eye: Normal extraocular motion.     Left eye: Normal extraocular motion.  Conjunctiva/sclera: Conjunctivae normal.     Pupils: Pupils are equal, round, and reactive to light.  Cardiovascular:     Rate and Rhythm: Normal rate and regular rhythm.     Pulses: Normal pulses.     Heart sounds: Normal heart sounds. No murmur heard. No friction rub. No gallop.   Pulmonary:     Effort: Pulmonary effort is normal. No respiratory distress.     Breath sounds: Normal breath sounds. No stridor. No wheezing, rhonchi or rales.  Musculoskeletal:     Cervical back: Normal range of motion and neck supple.  Lymphadenopathy:     Cervical: No cervical adenopathy.  Skin:    General: Skin is warm and dry.     Findings: No rash.   Neurological:     General: No focal deficit present.     Mental Status: She is alert and oriented to person, place, and time.  Psychiatric:        Mood and Affect: Mood normal.        Behavior: Behavior normal.        Thought Content: Thought content normal.      Assessment and Plan :   PDMP not reviewed this encounter.  1. Viral URI with cough   2. Left ear pain   3. Eustachian tube dysfunction, left     I reassured patient that there was no foreign body in her ear canal. Overall her exam is very reassuring. Will manage for viral illness such as eustachian tube dysfunction, viral URI, viral syndrome, viral rhinitis, COVID-19. Counseled patient on nature of COVID-19 including modes of transmission, diagnostic testing, management and supportive care.  Offered scripts for symptomatic relief. COVID 19 testing is pending. Counseled patient on potential for adverse effects with medications prescribed/recommended today, ER and return-to-clinic precautions discussed, patient verbalized understanding.     Wallis Bamberg, PA-C 08/23/20 1536

## 2020-08-23 NOTE — ED Triage Notes (Signed)
Pt in with c/o left ear pain, cough, and runny nose that has been going on for 1 week now  Pt has not had medication for sxs  States she has been around a sibling who recently tested positive for covid

## 2020-08-24 LAB — SARS CORONAVIRUS 2 (TAT 6-24 HRS): SARS Coronavirus 2: NEGATIVE
